# Patient Record
Sex: Female | Born: 1995 | Race: White | Hispanic: No | Marital: Single | Smoking: Never smoker
Health system: Southern US, Community
[De-identification: ages and names within clinical notes are randomized; demographics above are authoritative.]

## PROBLEM LIST (undated history)

## (undated) DIAGNOSIS — F988 Other specified behavioral and emotional disorders with onset usually occurring in childhood and adolescence: Secondary | ICD-10-CM

## (undated) DIAGNOSIS — R030 Elevated blood-pressure reading, without diagnosis of hypertension: Secondary | ICD-10-CM

## (undated) DIAGNOSIS — I10 Essential (primary) hypertension: Secondary | ICD-10-CM

## (undated) DIAGNOSIS — D649 Anemia, unspecified: Secondary | ICD-10-CM

## (undated) DIAGNOSIS — IMO0001 Reserved for inherently not codable concepts without codable children: Secondary | ICD-10-CM

## (undated) HISTORY — DX: Elevated blood-pressure reading, without diagnosis of hypertension: R03.0

## (undated) HISTORY — DX: Reserved for inherently not codable concepts without codable children: IMO0001

## (undated) HISTORY — DX: Essential (primary) hypertension: I10

## (undated) HISTORY — DX: Other specified behavioral and emotional disorders with onset usually occurring in childhood and adolescence: F98.8

## (undated) HISTORY — DX: Anemia, unspecified: D64.9

---
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
## 2017-01-31 ENCOUNTER — Ambulatory Visit (INDEPENDENT_AMBULATORY_CARE_PROVIDER_SITE_OTHER): Payer: BLUE CROSS/BLUE SHIELD

## 2017-01-31 DIAGNOSIS — Z3042 Encounter for surveillance of injectable contraceptive: Secondary | ICD-10-CM | POA: Diagnosis not present

## 2017-01-31 MED ORDER — MEDROXYPROGESTERONE ACETATE 150 MG/ML IM SUSP
150.0000 mg | Freq: Once | INTRAMUSCULAR | Status: AC
  Administered 2017-01-31: 150 mg via INTRAMUSCULAR

## 2017-01-31 NOTE — Progress Notes (Signed)
Here for depo shot only. No complaints. Is within time window. Shot given, tolerated well. Next appt set up.

## 2017-03-28 DIAGNOSIS — Z23 Encounter for immunization: Secondary | ICD-10-CM | POA: Diagnosis not present

## 2017-03-28 DIAGNOSIS — Z68.41 Body mass index (BMI) pediatric, 5th percentile to less than 85th percentile for age: Secondary | ICD-10-CM | POA: Diagnosis not present

## 2017-03-28 DIAGNOSIS — Z7182 Exercise counseling: Secondary | ICD-10-CM | POA: Diagnosis not present

## 2017-03-28 DIAGNOSIS — Z713 Dietary counseling and surveillance: Secondary | ICD-10-CM | POA: Diagnosis not present

## 2017-03-28 DIAGNOSIS — Z0100 Encounter for examination of eyes and vision without abnormal findings: Secondary | ICD-10-CM | POA: Diagnosis not present

## 2017-03-28 DIAGNOSIS — Z00129 Encounter for routine child health examination without abnormal findings: Secondary | ICD-10-CM | POA: Diagnosis not present

## 2017-04-18 ENCOUNTER — Ambulatory Visit: Payer: BLUE CROSS/BLUE SHIELD

## 2017-04-25 ENCOUNTER — Ambulatory Visit: Payer: BLUE CROSS/BLUE SHIELD

## 2017-04-27 ENCOUNTER — Ambulatory Visit (INDEPENDENT_AMBULATORY_CARE_PROVIDER_SITE_OTHER): Payer: BLUE CROSS/BLUE SHIELD

## 2017-04-27 DIAGNOSIS — Z3042 Encounter for surveillance of injectable contraceptive: Secondary | ICD-10-CM

## 2017-04-27 MED ORDER — MEDROXYPROGESTERONE ACETATE 150 MG/ML IM SUSP
150.0000 mg | Freq: Once | INTRAMUSCULAR | Status: AC
  Administered 2017-04-27: 150 mg via INTRAMUSCULAR

## 2017-04-27 NOTE — Progress Notes (Signed)
Pt presents for depo injection. Pt within depo window, no urine hcg needed. Injection given, tolerated well. F/u depo injection visit scheduled.   

## 2017-07-13 ENCOUNTER — Ambulatory Visit (INDEPENDENT_AMBULATORY_CARE_PROVIDER_SITE_OTHER): Payer: BLUE CROSS/BLUE SHIELD

## 2017-07-13 DIAGNOSIS — Z3042 Encounter for surveillance of injectable contraceptive: Secondary | ICD-10-CM

## 2017-07-13 DIAGNOSIS — Z113 Encounter for screening for infections with a predominantly sexual mode of transmission: Secondary | ICD-10-CM | POA: Diagnosis not present

## 2017-07-13 MED ORDER — MEDROXYPROGESTERONE ACETATE 150 MG/ML IM SUSP
150.0000 mg | Freq: Once | INTRAMUSCULAR | Status: AC
  Administered 2017-07-13: 150 mg via INTRAMUSCULAR

## 2017-07-13 MED ORDER — MEDROXYPROGESTERONE ACETATE 150 MG/ML IM SUSP: INTRAMUSCULAR | 0 refills | Status: DC

## 2017-07-13 NOTE — Progress Notes (Signed)
Pt presents for depo injection. Pt within depo window, no urine hcg needed. Injection given, tolerated well. F/u depo injection visit scheduled. Pt does need f/u with provider. Appt made.  Confidential number: [phone]

## 2017-07-13 NOTE — Addendum Note (Signed)
Addended by: Debroah LoopINGRAM, Maeby Vankleeck K on: 07/13/2017 10:29 AM   Modules accepted: Orders

## 2017-07-14 LAB — GC/CHLAMYDIA PROBE AMP
CT Probe RNA: NOT DETECTED
GC Probe RNA: NOT DETECTED

## 2017-08-29 ENCOUNTER — Telehealth: Payer: Self-pay | Admitting: Cardiology

## 2017-08-29 NOTE — Telephone Encounter (Signed)
Received records from [facility] for appointment on 08/31/17 with Dr Herbie Baltimore.  Records put with Dr Elissa Hefty schedule for 08/31/17.  lp

## 2017-08-31 ENCOUNTER — Ambulatory Visit: Payer: BLUE CROSS/BLUE SHIELD | Admitting: Cardiology

## 2017-09-16 ENCOUNTER — Ambulatory Visit: Payer: BLUE CROSS/BLUE SHIELD | Admitting: Cardiology

## 2017-09-22 DIAGNOSIS — Z23 Encounter for immunization: Secondary | ICD-10-CM | POA: Diagnosis not present

## 2017-09-29 DIAGNOSIS — F419 Anxiety disorder, unspecified: Secondary | ICD-10-CM | POA: Diagnosis not present

## 2017-09-29 DIAGNOSIS — F900 Attention-deficit hyperactivity disorder, predominantly inattentive type: Secondary | ICD-10-CM | POA: Diagnosis not present

## 2017-09-29 DIAGNOSIS — Z68.41 Body mass index (BMI) pediatric, 5th percentile to less than 85th percentile for age: Secondary | ICD-10-CM | POA: Diagnosis not present

## 2017-09-29 DIAGNOSIS — Z3042 Encounter for surveillance of injectable contraceptive: Secondary | ICD-10-CM | POA: Diagnosis not present

## 2017-09-29 DIAGNOSIS — F329 Major depressive disorder, single episode, unspecified: Secondary | ICD-10-CM | POA: Diagnosis not present

## 2017-10-02 ENCOUNTER — Other Ambulatory Visit: Payer: Self-pay | Admitting: Pediatrics

## 2017-10-26 ENCOUNTER — Ambulatory Visit: Payer: BLUE CROSS/BLUE SHIELD | Admitting: Pediatrics

## 2017-10-26 ENCOUNTER — Encounter: Payer: Self-pay | Admitting: Pediatrics

## 2017-10-26 VITALS — BP 119/75 | HR 86 | Ht 64.96 in | Wt 141.8 lb

## 2017-10-26 DIAGNOSIS — Z3042 Encounter for surveillance of injectable contraceptive: Secondary | ICD-10-CM | POA: Diagnosis not present

## 2017-10-26 DIAGNOSIS — N946 Dysmenorrhea, unspecified: Secondary | ICD-10-CM | POA: Diagnosis not present

## 2017-10-26 MED ORDER — MEDROXYPROGESTERONE ACETATE 150 MG/ML IM SUSP: INTRAMUSCULAR | 0 refills | Status: DC

## 2017-10-26 NOTE — Progress Notes (Signed)
THIS RECORD MAY CONTAIN CONFIDENTIAL INFORMATION THAT SHOULD NOT BE RELEASED WITHOUT REVIEW OF THE SERVICE PROVIDER.  Adolescent Medicine Consultation Follow-Up Visit Kellie Stone  is a 21 y.o. female referred by Armandina StammerKeiffer, Rebecca, MD here today for follow-up regarding contraception.    Last seen in Adolescent Medicine Clinic on 07/13/17 for depo injection RN visit.  Plan at last visit included depo injection.  Pertinent Labs? No Growth Chart Viewed? no   History was provided by the patient.  Interpreter? no  PCP Confirmed?  no  My Chart Activated?   no    Chief Complaint  Patient presents with  . Follow-up    HPI:   Returns for Depo. 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  Took last dose while at school within window.  Needs shot today and also script for next dose when she is in college.  No BTB bleeding.   Review of Systems  Constitutional: Negative for malaise/fatigue.  Eyes: Negative for double vision.  Respiratory: Negative for shortness of breath.   Cardiovascular: Negative for chest pain and palpitations.  Gastrointestinal: Negative for abdominal pain, constipation, diarrhea, nausea and vomiting.  Genitourinary: Negative for dysuria.  Musculoskeletal: Negative for joint pain and myalgias.  Skin: Negative for rash.  Neurological: Negative for dizziness and headaches.  Endo/Heme/Allergies: Does not bruise/bleed easily.     No LMP recorded. Allergies  Allergen Reactions  . Latex    Outpatient Medications Prior to Visit  Medication Sig Dispense Refill  . amphetamine-dextroamphetamine (ADDERALL) 10 MG tablet Take 10 mg by mouth 2 (two) times daily with a meal.    . medroxyPROGESTERone (DEPO-PROVERA) 150 MG/ML injection INJECT INTO THE MUSCLE ONCE BETWEEN 09/28/17 AND 10/12/17 1 mL 0  . sertraline (ZOLOFT) 100 MG tablet Take 100 mg by mouth daily.   4  . naproxen (NAPROSYN) 500 MG tablet Take 500 mg by mouth daily as needed for mild pain or moderate pain.     No  facility-administered medications prior to visit.      Patient Active Problem List   Diagnosis Date Noted  . Secondary hypertension, unspecified 09/30/2015  . Menorrhagia with regular cycle 05/08/2015  . Dysmenorrhea in the adolescent 05/08/2015  . Family history of thromboembolic disease 16/10/960406/01/2015   Confidentiality was discussed with the patient and if applicable, with caregiver as well.  Physical Exam:  Vitals:   10/26/17 0949  BP: 119/75  Pulse: 86  Weight: 141 lb 12.8 oz (64.3 kg)  Height: 5' 4.96" (1.65 m)   BP 119/75   Pulse 86   Ht 5' 4.96" (1.65 m)   Wt 141 lb 12.8 oz (64.3 kg)   BMI 23.63 kg/m  Body mass index: body mass index is 23.63 kg/m. Blood pressure percentiles are 75 % systolic and 84 % diastolic based on the August 2017 AAP Clinical Practice Guideline. Blood pressure percentile targets: 90: 127/78, 95: 129/81, 95 + 12 mmHg: 141/93.  Physical Exam  Constitutional: She is oriented to person, place, and time. She appears well-developed. No distress.  HENT:  Head: Normocephalic and atraumatic.  Eyes: EOM are normal. Pupils are equal, round, and reactive to light. No scleral icterus.  Neck: Normal range of motion. Neck supple. No thyromegaly present.  Cardiovascular: Normal rate, regular rhythm, normal heart sounds and intact distal pulses.  No murmur heard. Pulmonary/Chest: Effort normal and breath sounds normal.  Abdominal: Soft.  Musculoskeletal: Normal range of motion. She exhibits no edema.  Lymphadenopathy:    She has no cervical adenopathy.  Neurological: She  is alert and oriented to person, place, and time. No cranial nerve deficit.  Skin: Skin is warm and dry. No rash noted.  Psychiatric: She has a normal mood and affect. Her behavior is normal. Judgment and thought content normal.   Assessment/Plan: 1. Encounter for Depo-Provera contraception -injection today  -gave printed script for next dose to take with her to school  -depo calendar  given -condom use reviewed   2. Dysmenorrhea in the adolescent -as above and well-controlled with current regimen.   Follow-up:  Call to schedule after next dose given at school for follow-up in clinic    Medical decision-making:  >15 minutes spent face to face with patient with more than 50% of appointment spent discussing diagnosis, management, follow-up, and reviewing of depo injection, side effects, bone density after 2 years.

## 2017-10-26 NOTE — Patient Instructions (Signed)
Call to schedule a follow up within next 3 months after your next Depo.  It was great to see you in clinic today!

## 2017-10-26 NOTE — Progress Notes (Signed)
Rec'd depo shot at school on 10/25.

## 2017-12-13 DIAGNOSIS — R002 Palpitations: Secondary | ICD-10-CM | POA: Diagnosis not present

## 2017-12-13 DIAGNOSIS — I10 Essential (primary) hypertension: Secondary | ICD-10-CM | POA: Diagnosis not present

## 2017-12-13 DIAGNOSIS — J45909 Unspecified asthma, uncomplicated: Secondary | ICD-10-CM | POA: Diagnosis not present

## 2017-12-13 DIAGNOSIS — F418 Other specified anxiety disorders: Secondary | ICD-10-CM | POA: Diagnosis not present

## 2017-12-16 DIAGNOSIS — I10 Essential (primary) hypertension: Secondary | ICD-10-CM | POA: Diagnosis not present

## 2017-12-16 DIAGNOSIS — R0602 Shortness of breath: Secondary | ICD-10-CM | POA: Diagnosis not present

## 2017-12-16 DIAGNOSIS — R55 Syncope and collapse: Secondary | ICD-10-CM | POA: Diagnosis not present

## 2017-12-16 DIAGNOSIS — R5383 Other fatigue: Secondary | ICD-10-CM | POA: Diagnosis not present

## 2017-12-21 DIAGNOSIS — F900 Attention-deficit hyperactivity disorder, predominantly inattentive type: Secondary | ICD-10-CM | POA: Diagnosis not present

## 2017-12-21 DIAGNOSIS — Z68.41 Body mass index (BMI) pediatric, 5th percentile to less than 85th percentile for age: Secondary | ICD-10-CM | POA: Diagnosis not present

## 2017-12-21 DIAGNOSIS — F419 Anxiety disorder, unspecified: Secondary | ICD-10-CM | POA: Diagnosis not present

## 2017-12-21 DIAGNOSIS — F329 Major depressive disorder, single episode, unspecified: Secondary | ICD-10-CM | POA: Diagnosis not present

## 2017-12-27 DIAGNOSIS — R002 Palpitations: Secondary | ICD-10-CM | POA: Diagnosis not present

## 2017-12-27 DIAGNOSIS — R55 Syncope and collapse: Secondary | ICD-10-CM | POA: Diagnosis not present

## 2018-01-19 DIAGNOSIS — I10 Essential (primary) hypertension: Secondary | ICD-10-CM | POA: Diagnosis not present

## 2018-01-19 DIAGNOSIS — R002 Palpitations: Secondary | ICD-10-CM | POA: Diagnosis not present

## 2018-01-19 DIAGNOSIS — F418 Other specified anxiety disorders: Secondary | ICD-10-CM | POA: Diagnosis not present

## 2018-02-02 DIAGNOSIS — I10 Essential (primary) hypertension: Secondary | ICD-10-CM | POA: Diagnosis not present

## 2018-02-02 DIAGNOSIS — I471 Supraventricular tachycardia: Secondary | ICD-10-CM | POA: Diagnosis not present

## 2018-02-02 DIAGNOSIS — J45909 Unspecified asthma, uncomplicated: Secondary | ICD-10-CM | POA: Diagnosis not present

## 2018-03-01 DIAGNOSIS — Z68.41 Body mass index (BMI) pediatric, 5th percentile to less than 85th percentile for age: Secondary | ICD-10-CM | POA: Diagnosis not present

## 2018-03-01 DIAGNOSIS — B9689 Other specified bacterial agents as the cause of diseases classified elsewhere: Secondary | ICD-10-CM | POA: Diagnosis not present

## 2018-03-01 DIAGNOSIS — Z20818 Contact with and (suspected) exposure to other bacterial communicable diseases: Secondary | ICD-10-CM | POA: Diagnosis not present

## 2018-03-01 DIAGNOSIS — J028 Acute pharyngitis due to other specified organisms: Secondary | ICD-10-CM | POA: Diagnosis not present

## 2018-04-06 DIAGNOSIS — Z79899 Other long term (current) drug therapy: Secondary | ICD-10-CM | POA: Diagnosis not present

## 2018-04-06 DIAGNOSIS — F900 Attention-deficit hyperactivity disorder, predominantly inattentive type: Secondary | ICD-10-CM | POA: Diagnosis not present

## 2018-04-06 DIAGNOSIS — F329 Major depressive disorder, single episode, unspecified: Secondary | ICD-10-CM | POA: Diagnosis not present

## 2018-04-06 DIAGNOSIS — F419 Anxiety disorder, unspecified: Secondary | ICD-10-CM | POA: Diagnosis not present

## 2018-04-10 ENCOUNTER — Ambulatory Visit (INDEPENDENT_AMBULATORY_CARE_PROVIDER_SITE_OTHER): Payer: BLUE CROSS/BLUE SHIELD | Admitting: Pediatrics

## 2018-04-10 ENCOUNTER — Encounter: Payer: Self-pay | Admitting: Pediatrics

## 2018-04-10 VITALS — BP 150/80 | HR 89 | Ht 64.57 in | Wt 142.6 lb

## 2018-04-10 DIAGNOSIS — N946 Dysmenorrhea, unspecified: Secondary | ICD-10-CM

## 2018-04-10 DIAGNOSIS — Z0000 Encounter for general adult medical examination without abnormal findings: Secondary | ICD-10-CM | POA: Diagnosis not present

## 2018-04-10 DIAGNOSIS — I159 Secondary hypertension, unspecified: Secondary | ICD-10-CM | POA: Diagnosis not present

## 2018-04-10 DIAGNOSIS — N920 Excessive and frequent menstruation with regular cycle: Secondary | ICD-10-CM | POA: Diagnosis not present

## 2018-04-10 DIAGNOSIS — Z3202 Encounter for pregnancy test, result negative: Secondary | ICD-10-CM

## 2018-04-10 DIAGNOSIS — Z30017 Encounter for initial prescription of implantable subdermal contraceptive: Secondary | ICD-10-CM

## 2018-04-10 DIAGNOSIS — M7582 Other shoulder lesions, left shoulder: Secondary | ICD-10-CM | POA: Diagnosis not present

## 2018-04-10 DIAGNOSIS — Z8249 Family history of ischemic heart disease and other diseases of the circulatory system: Secondary | ICD-10-CM

## 2018-04-10 DIAGNOSIS — Z113 Encounter for screening for infections with a predominantly sexual mode of transmission: Secondary | ICD-10-CM

## 2018-04-10 LAB — POCT URINE PREGNANCY: PREG TEST UR: NEGATIVE

## 2018-04-10 MED ORDER — ETONOGESTREL 68 MG ~~LOC~~ IMPL
68.0000 mg | DRUG_IMPLANT | Freq: Once | SUBCUTANEOUS | Status: AC
  Administered 2018-04-10: 68 mg via SUBCUTANEOUS

## 2018-04-10 NOTE — Progress Notes (Signed)
THIS RECORD MAY CONTAIN CONFIDENTIAL INFORMATION THAT SHOULD NOT BE RELEASED WITHOUT REVIEW OF THE SERVICE PROVIDER.  Adolescent Medicine Consultation Follow-Up Visit Kellie Stone  is a 22 y.o. female referred by Kellie Stammer, MD here today for follow-up regarding contraception.    Last seen in Adolescent Medicine Clinic on 10/26/17 for contraception.  Plan at last visit included starting Depo-Provera injection.  Pertinent Labs? No Growth Chart Viewed? yes   History was provided by the patient.  Interpreter? no  PCP Confirmed?  yes   Chief Complaint  Patient presents with  . Follow-up    needs hep b titer    HPI:  Kellie Stone is a 22 year old female presenting for follow up of contraception. She provides the history:  - Has been getting Depo shots for about 3 years and will now like to transition to another form of birth control that requires less follow up (especially since she is in college) - She is interest in Nexplanon since the IUD is associated with some degree of pain on placement - Menses at this time occur ~ 3 months - Bleeding is light with associated mild cramping  - Has been sexually active in the past year with female partner - Denies HA, vision changes, urinary symptoms, vaginal symptoms, weight loss - Medications include Vyvanse 60 mg, Zoloft 100 mg, and intermittently Adderall 10 mg (1x every 2 months) - Family history notable for father with blood clots at ~ 94 years of age - Recently diagnosed with inappropriate sinus tachycardia about 2 months ago - has seen 3 cardiologist in the past 2 years - Blood pressure cuff at home and normal range is about 145/85 (not currently taking any anti-hypertensives) - LMP ~ January 2019    No LMP recorded. Allergies  Allergen Reactions  . Latex    Outpatient Medications Prior to Visit  Medication Sig Dispense Refill  . sertraline (ZOLOFT) 100 MG tablet Take 100 mg by mouth daily.   4  . amphetamine-dextroamphetamine  (ADDERALL) 10 MG tablet Take 10 mg by mouth 2 (two) times daily with a meal.    . naproxen (NAPROSYN) 500 MG tablet Take 500 mg by mouth daily as needed for mild pain or moderate pain.    Marland Kitchen VYVANSE 60 MG capsule   0  . medroxyPROGESTERone (DEPO-PROVERA) 150 MG/ML injection INJECT INTO THE MUSCLE ONCE BETWEEN 09/28/17 AND 10/12/17 (Patient not taking: Reported on 04/10/2018) 1 mL 0   No facility-administered medications prior to visit.      Patient Active Problem List   Diagnosis Date Noted  . Heart palpitations 01/02/2016  . Secondary hypertension, unspecified 09/30/2015  . Menorrhagia with regular cycle 05/08/2015  . Dysmenorrhea in the adolescent 05/08/2015  . Family history of thromboembolic disease 40/98/1191     Physical Exam:  Vitals:   04/10/18 1426  BP: (!) 150/80  Pulse: 89  Weight: 142 lb 9.6 oz (64.7 kg)  Height: 5' 4.57" (1.64 m)   BP (!) 150/80   Pulse 89   Ht 5' 4.57" (1.64 m)   Wt 142 lb 9.6 oz (64.7 kg)   BMI 24.05 kg/m  Body mass index: body mass index is 24.05 kg/m. Blood pressure percentiles are not available for patients who are 18 years or older.  Physical Exam  Constitutional: She is oriented to person, place, and time. She appears well-developed and well-nourished. No distress.  HENT:  Mouth/Throat: Oropharynx is clear and moist.  Eyes: Pupils are equal, round, and reactive to light.  Conjunctivae and EOM are normal.  Neck: Normal range of motion. No thyromegaly present.  Cardiovascular: Normal rate, regular rhythm, normal heart sounds and intact distal pulses.  Pulmonary/Chest: Effort normal and breath sounds normal.  Abdominal: Soft. Bowel sounds are normal.  Neurological: She is alert and oriented to person, place, and time.  Skin: Skin is warm. Capillary refill takes 2 to 3 seconds.  Psychiatric: She has a normal mood and affect.    Assessment/Plan: Kellie Stone is a 22 year old female presenting for follow up of changing contraception methods. She has  previously been stable on Depo injections but desired a longer acting method. Following discussion and counseling on different methods, decided to have a Nexplanon placed. No contraindications identified, however, risk factors include family history of blood clots/PE and hypertension of unknown etiology.  Encounter for contraception - Discontinue Depo injections - Placed Nexplanon in LEFT arm today in clinic - Reviewed care immediately following Nexplanon placement and adverse effects - Return to clinic in 3 months  Health Maintenance - Hep B Surface Antibody titer    Follow-up:  3 months  Medical decision-making:  >30 minutes spent face to face with patient with more than 50% of appointment spent discussing diagnosis, management, follow-up, and reviewing of contraception choices.

## 2018-04-10 NOTE — Progress Notes (Signed)
Nexplanon Insertion  No contraindications for placement.  No liver disease, no unexplained vaginal bleeding, no h/o breast cancer, no h/o blood clots.  LMP ~ January 2019  UHCG: Negative  Last Unprotected sex:  > 7 months ago  Risks & benefits of Nexplanon discussed The nexplanon device was purchased and supplied by [facility]. Packaging instructions supplied to patient Consent form signed  The patient denies any allergies to anesthetics or antiseptics.  Procedure: Pt was placed in supine position. The left arm was flexed at the elbow and externally rotated so that her wrist was parallel to her ear The medial epicondyle of the left arm was identified The insertions site was marked 8 cm proximal to the medial epicondyle The insertion site was cleaned with Betadine The area surrounding the insertion site was covered with a sterile drape 1% lidocaine was injected just under the skin at the insertion site extending 4 cm proximally. The sterile preloaded disposable Nexaplanon applicator was removed from the sterile packaging The applicator needle was inserted at a 30 degree angle at 8 cm proximal to the medial epicondyle as marked The applicator was lowered to a horizontal position and advanced just under the skin for the full length of the needle The slider on the applicator was retracted fully while the applicator remained in the same position, then the applicator was removed. The implant was confirmed via palpation as being in position The implant position was demonstrated to the patient Pressure dressing was applied to the patient.  The patient was instructed to removed the pressure dressing in 24 hrs.  The patient was advised to move slowly from a supine to an upright position  The patient denied any concerns or complaints  The patient was instructed to schedule a follow-up appt in 1 month and to call sooner if any concerns.  The patient acknowledged agreement and understanding of  the plan.

## 2018-04-10 NOTE — Patient Instructions (Signed)
° °  Congratulations on getting your Nexplanon placement!  Below is some important information about Nexplanon. ° °First remember that Nexplanon does not prevent sexually transmitted infections.  Condoms will help prevent sexually transmitted infections. °The Nexplanon starts working 7 days after it was inserted.  There is a risk of getting pregnant if you have unprotected sex in those first 7 days after placement of the Nexplanon. ° °The Nexplanon lasts for 3 years but can be removed at any time.  You can become pregnant as early as 1 week after removal.  You can have a new Nexplanon put in after the old one is removed if you like. ° °It is not known whether Nexplanon is as effective in women who are very overweight because the studies did not include many overweight women. ° °Nexplanon interacts with some medications, including barbiturates, bosentan, carbamazepine, felbamate, griseofulvin, oxcarbazepine, phenytoin, rifampin, St. John's wort, topiramate, HIV medicines.  Please alert your doctor if you are on any of these medicines. ° °Always tell other healthcare providers that you have a Nexplanon in your arm. ° °The Nexplanon was placed just under the skin.  Leave the outside bandage on for 24 hours.  Leave the smaller bandage on for 3-5 days or until it falls off on its own.  Keep the area clean and dry for 3-5 days. °There is usually bruising or swelling at the insertion site for a few days to a week after placement.  If you see redness or pus draining from the insertion site, call us immediately. ° °Keep your user card with the date the implant was placed and the date the implant is to be removed. ° °The most common side effect is a change in your menstrual bleeding pattern.   This bleeding is generally not harmful to you but can be annoying.  Call or come in to see us if you have any concerns about the bleeding or if you have any side effects or questions.   ° °We will call you in 1 week to check in and we  would like you to return to the clinic for a follow-up visit in 1 month. ° °You can call [facility] 24 hours a day with any questions or concerns.  There is always a nurse or doctor available to take your call.  Call 9-1-1 if you have a life-threatening emergency.  For anything else, please call us at [phone] before heading to the ER. °

## 2018-04-11 LAB — C. TRACHOMATIS/N. GONORRHOEAE RNA
C. trachomatis RNA, TMA: NOT DETECTED
N. gonorrhoeae RNA, TMA: NOT DETECTED

## 2018-04-11 LAB — HEPATITIS B SURFACE ANTIBODY,QUALITATIVE: Hep B S Ab: NONREACTIVE

## 2018-04-25 DIAGNOSIS — Z23 Encounter for immunization: Secondary | ICD-10-CM | POA: Diagnosis not present

## 2018-08-14 DIAGNOSIS — Z68.41 Body mass index (BMI) pediatric, 5th percentile to less than 85th percentile for age: Secondary | ICD-10-CM | POA: Diagnosis not present

## 2018-08-14 DIAGNOSIS — Z79899 Other long term (current) drug therapy: Secondary | ICD-10-CM | POA: Diagnosis not present

## 2018-08-14 DIAGNOSIS — Z0184 Encounter for antibody response examination: Secondary | ICD-10-CM | POA: Diagnosis not present

## 2018-08-14 DIAGNOSIS — F900 Attention-deficit hyperactivity disorder, predominantly inattentive type: Secondary | ICD-10-CM | POA: Diagnosis not present

## 2018-08-14 DIAGNOSIS — Z789 Other specified health status: Secondary | ICD-10-CM | POA: Diagnosis not present

## 2018-08-16 DIAGNOSIS — Z23 Encounter for immunization: Secondary | ICD-10-CM | POA: Diagnosis not present

## 2018-09-07 DIAGNOSIS — Z23 Encounter for immunization: Secondary | ICD-10-CM | POA: Diagnosis not present

## 2018-11-13 DIAGNOSIS — F900 Attention-deficit hyperactivity disorder, predominantly inattentive type: Secondary | ICD-10-CM | POA: Diagnosis not present

## 2018-11-13 DIAGNOSIS — Z79899 Other long term (current) drug therapy: Secondary | ICD-10-CM | POA: Diagnosis not present

## 2018-11-13 DIAGNOSIS — F418 Other specified anxiety disorders: Secondary | ICD-10-CM | POA: Diagnosis not present

## 2018-11-23 DIAGNOSIS — H66002 Acute suppurative otitis media without spontaneous rupture of ear drum, left ear: Secondary | ICD-10-CM | POA: Diagnosis not present

## 2019-03-16 DIAGNOSIS — Z79899 Other long term (current) drug therapy: Secondary | ICD-10-CM | POA: Diagnosis not present

## 2019-03-16 DIAGNOSIS — F418 Other specified anxiety disorders: Secondary | ICD-10-CM | POA: Diagnosis not present

## 2019-03-16 DIAGNOSIS — F900 Attention-deficit hyperactivity disorder, predominantly inattentive type: Secondary | ICD-10-CM | POA: Diagnosis not present

## 2019-04-30 DIAGNOSIS — Z111 Encounter for screening for respiratory tuberculosis: Secondary | ICD-10-CM | POA: Diagnosis not present

## 2019-05-04 DIAGNOSIS — Z03818 Encounter for observation for suspected exposure to other biological agents ruled out: Secondary | ICD-10-CM | POA: Diagnosis not present

## 2019-05-16 DIAGNOSIS — Z23 Encounter for immunization: Secondary | ICD-10-CM | POA: Diagnosis not present

## 2019-05-16 DIAGNOSIS — Z020 Encounter for examination for admission to educational institution: Secondary | ICD-10-CM | POA: Diagnosis not present

## 2019-07-03 DIAGNOSIS — Z1159 Encounter for screening for other viral diseases: Secondary | ICD-10-CM | POA: Diagnosis not present

## 2019-07-03 DIAGNOSIS — Z020 Encounter for examination for admission to educational institution: Secondary | ICD-10-CM | POA: Diagnosis not present

## 2019-07-25 DIAGNOSIS — Z79899 Other long term (current) drug therapy: Secondary | ICD-10-CM | POA: Diagnosis not present

## 2019-07-25 DIAGNOSIS — F900 Attention-deficit hyperactivity disorder, predominantly inattentive type: Secondary | ICD-10-CM | POA: Diagnosis not present

## 2019-08-16 DIAGNOSIS — Z03818 Encounter for observation for suspected exposure to other biological agents ruled out: Secondary | ICD-10-CM | POA: Diagnosis not present

## 2019-08-20 DIAGNOSIS — Z23 Encounter for immunization: Secondary | ICD-10-CM | POA: Diagnosis not present

## 2019-10-16 DIAGNOSIS — Z79899 Other long term (current) drug therapy: Secondary | ICD-10-CM | POA: Diagnosis not present

## 2019-10-16 DIAGNOSIS — F418 Other specified anxiety disorders: Secondary | ICD-10-CM | POA: Diagnosis not present

## 2019-10-16 DIAGNOSIS — N921 Excessive and frequent menstruation with irregular cycle: Secondary | ICD-10-CM | POA: Diagnosis not present

## 2019-10-16 DIAGNOSIS — F900 Attention-deficit hyperactivity disorder, predominantly inattentive type: Secondary | ICD-10-CM | POA: Diagnosis not present

## 2019-10-18 DIAGNOSIS — Z3046 Encounter for surveillance of implantable subdermal contraceptive: Secondary | ICD-10-CM | POA: Diagnosis not present

## 2019-10-18 DIAGNOSIS — Z309 Encounter for contraceptive management, unspecified: Secondary | ICD-10-CM | POA: Diagnosis not present

## 2019-10-18 DIAGNOSIS — Z30013 Encounter for initial prescription of injectable contraceptive: Secondary | ICD-10-CM | POA: Diagnosis not present

## 2019-10-18 DIAGNOSIS — Z3009 Encounter for other general counseling and advice on contraception: Secondary | ICD-10-CM | POA: Diagnosis not present

## 2019-11-12 DIAGNOSIS — Z20828 Contact with and (suspected) exposure to other viral communicable diseases: Secondary | ICD-10-CM | POA: Diagnosis not present
